# Patient Record
Sex: Female | Born: 1995 | Race: White | Hispanic: No | Marital: Single | Smoking: Current some day smoker
Health system: Southern US, Community
[De-identification: ages and names within clinical notes are randomized; demographics above are authoritative.]

## PROBLEM LIST (undated history)

## (undated) DIAGNOSIS — Q796 Ehlers-Danlos syndrome, unspecified: Secondary | ICD-10-CM

## (undated) DIAGNOSIS — F909 Attention-deficit hyperactivity disorder, unspecified type: Secondary | ICD-10-CM

## (undated) DIAGNOSIS — G90A Postural orthostatic tachycardia syndrome (POTS): Secondary | ICD-10-CM

## (undated) DIAGNOSIS — I498 Other specified cardiac arrhythmias: Secondary | ICD-10-CM

## (undated) DIAGNOSIS — F419 Anxiety disorder, unspecified: Secondary | ICD-10-CM

## (undated) DIAGNOSIS — D500 Iron deficiency anemia secondary to blood loss (chronic): Secondary | ICD-10-CM

## (undated) DIAGNOSIS — S069X9A Unspecified intracranial injury with loss of consciousness of unspecified duration, initial encounter: Secondary | ICD-10-CM

## (undated) HISTORY — DX: Other specified cardiac arrhythmias: I49.8

## (undated) HISTORY — DX: Attention-deficit hyperactivity disorder, unspecified type: F90.9

## (undated) HISTORY — DX: Ehlers-Danlos syndrome, unspecified: Q79.60

## (undated) HISTORY — DX: Postural orthostatic tachycardia syndrome (POTS): G90.A

---
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
## 2016-06-07 ENCOUNTER — Emergency Department (HOSPITAL_COMMUNITY): Payer: BC Managed Care – PPO

## 2016-06-07 ENCOUNTER — Emergency Department (HOSPITAL_COMMUNITY)
Admission: EM | Admit: 2016-06-07 | Discharge: 2016-06-07 | Disposition: A | Discharge status: Home / Self Care | Payer: BC Managed Care – PPO | Attending: Emergency Medicine | Admitting: Emergency Medicine

## 2016-06-07 ENCOUNTER — Encounter (HOSPITAL_COMMUNITY): Payer: Self-pay | Admitting: Emergency Medicine

## 2016-06-07 DIAGNOSIS — E876 Hypokalemia: Secondary | ICD-10-CM

## 2016-06-07 DIAGNOSIS — Z79899 Other long term (current) drug therapy: Secondary | ICD-10-CM | POA: Diagnosis not present

## 2016-06-07 DIAGNOSIS — W010XXA Fall on same level from slipping, tripping and stumbling without subsequent striking against object, initial encounter: Secondary | ICD-10-CM | POA: Insufficient documentation

## 2016-06-07 DIAGNOSIS — S50311A Abrasion of right elbow, initial encounter: Secondary | ICD-10-CM | POA: Insufficient documentation

## 2016-06-07 DIAGNOSIS — S80812A Abrasion, left lower leg, initial encounter: Secondary | ICD-10-CM | POA: Insufficient documentation

## 2016-06-07 DIAGNOSIS — R4182 Altered mental status, unspecified: Secondary | ICD-10-CM | POA: Diagnosis present

## 2016-06-07 DIAGNOSIS — Y999 Unspecified external cause status: Secondary | ICD-10-CM | POA: Diagnosis not present

## 2016-06-07 DIAGNOSIS — F10129 Alcohol abuse with intoxication, unspecified: Secondary | ICD-10-CM | POA: Diagnosis not present

## 2016-06-07 DIAGNOSIS — F10929 Alcohol use, unspecified with intoxication, unspecified: Secondary | ICD-10-CM

## 2016-06-07 DIAGNOSIS — Y939 Activity, unspecified: Secondary | ICD-10-CM | POA: Diagnosis not present

## 2016-06-07 DIAGNOSIS — Y92410 Unspecified street and highway as the place of occurrence of the external cause: Secondary | ICD-10-CM | POA: Insufficient documentation

## 2016-06-07 DIAGNOSIS — S80811A Abrasion, right lower leg, initial encounter: Secondary | ICD-10-CM | POA: Insufficient documentation

## 2016-06-07 HISTORY — DX: Unspecified intracranial injury with loss of consciousness of unspecified duration, initial encounter: S06.9X9A

## 2016-06-07 HISTORY — DX: Iron deficiency anemia secondary to blood loss (chronic): D50.0

## 2016-06-07 HISTORY — DX: Anxiety disorder, unspecified: F41.9

## 2016-06-07 LAB — I-STAT CHEM 8, ED
BUN: 6 mg/dL (ref 6–20)
CALCIUM ION: 0.99 mmol/L — AB (ref 1.15–1.40)
Chloride: 105 mmol/L (ref 101–111)
Creatinine, Ser: 0.9 mg/dL (ref 0.44–1.00)
GLUCOSE: 88 mg/dL (ref 65–99)
HCT: 38 % (ref 36.0–46.0)
Hemoglobin: 12.9 g/dL (ref 12.0–15.0)
Potassium: 2.8 mmol/L — ABNORMAL LOW (ref 3.5–5.1)
SODIUM: 141 mmol/L (ref 135–145)
TCO2: 20 mmol/L (ref 0–100)

## 2016-06-07 LAB — RAPID URINE DRUG SCREEN, HOSP PERFORMED
AMPHETAMINES: POSITIVE — AB
BENZODIAZEPINES: NOT DETECTED
Barbiturates: NOT DETECTED
COCAINE: NOT DETECTED
Opiates: NOT DETECTED
Tetrahydrocannabinol: NOT DETECTED

## 2016-06-07 LAB — COMPREHENSIVE METABOLIC PANEL
ALBUMIN: 4.2 g/dL (ref 3.5–5.0)
ALT: 13 U/L — ABNORMAL LOW (ref 14–54)
ANION GAP: 11 (ref 5–15)
AST: 19 U/L (ref 15–41)
Alkaline Phosphatase: 69 U/L (ref 38–126)
BILIRUBIN TOTAL: 0.4 mg/dL (ref 0.3–1.2)
BUN: 8 mg/dL (ref 6–20)
CO2: 21 mmol/L — AB (ref 22–32)
Calcium: 8.7 mg/dL — ABNORMAL LOW (ref 8.9–10.3)
Chloride: 107 mmol/L (ref 101–111)
Creatinine, Ser: 0.65 mg/dL (ref 0.44–1.00)
GFR calc non Af Amer: >60 mL/min (ref >60)
GLUCOSE: 87 mg/dL (ref 65–99)
POTASSIUM: 2.7 mmol/L — AB (ref 3.5–5.1)
SODIUM: 139 mmol/L (ref 135–145)
TOTAL PROTEIN: 7.8 g/dL (ref 6.5–8.1)

## 2016-06-07 LAB — POC URINE PREG, ED: Preg Test, Ur: NEGATIVE

## 2016-06-07 LAB — ETHANOL: ALCOHOL ETHYL (B): 238 mg/dL — AB (ref <5)

## 2016-06-07 LAB — ACETAMINOPHEN LEVEL

## 2016-06-07 LAB — SALICYLATE LEVEL: Salicylate Lvl: <4 mg/dL (ref 2.8–30.0)

## 2016-06-07 LAB — MAGNESIUM: MAGNESIUM: 2.3 mg/dL (ref 1.7–2.4)

## 2016-06-07 MED ORDER — POTASSIUM CHLORIDE 10 MEQ/100ML IV SOLN
10.0000 meq | INTRAVENOUS | Status: DC
  Administered 2016-06-07: 10 meq via INTRAVENOUS
  Filled 2016-06-07: qty 100

## 2016-06-07 MED ORDER — SODIUM CHLORIDE 0.9 % IV BOLUS (SEPSIS)
1000.0000 mL | Freq: Once | INTRAVENOUS | Status: AC
  Administered 2016-06-07: 1000 mL via INTRAVENOUS

## 2016-06-07 MED ORDER — POTASSIUM CHLORIDE CRYS ER 20 MEQ PO TBCR: 40.0000 meq | EXTENDED_RELEASE_TABLET | Freq: Two times a day (BID) | ORAL | 0 refills | Status: DC

## 2016-06-07 MED ORDER — ONDANSETRON HCL 4 MG/2ML IJ SOLN
4.0000 mg | Freq: Once | INTRAMUSCULAR | Status: AC
  Administered 2016-06-07: 4 mg via INTRAVENOUS

## 2016-06-07 MED ORDER — POTASSIUM CHLORIDE CRYS ER 20 MEQ PO TBCR
80.0000 meq | EXTENDED_RELEASE_TABLET | Freq: Once | ORAL | Status: AC
  Administered 2016-06-07: 80 meq via ORAL
  Filled 2016-06-07: qty 4

## 2016-06-07 MED ORDER — SODIUM CHLORIDE 0.9 % IV SOLN
INTRAVENOUS | Status: DC
  Administered 2016-06-07: 03:00:00 via INTRAVENOUS

## 2016-06-07 NOTE — ED Notes (Signed)
Pt transported to radiology via stretcher 

## 2016-06-07 NOTE — ED Notes (Signed)
Pt vomited large amount in L lateral position, pt cleaned and linens changed.
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
## 2016-06-07 NOTE — ED Notes (Signed)
Pt able to tolerate PO fluid and take PO potassium. Mother and father at bedside.

## 2016-06-07 NOTE — ED Notes (Signed)
Pt gave permission when she initially arrived for staff to speak with her mother. Mother called on pts cellphone, [place] and will be here in @ 2 hrs.

## 2016-06-07 NOTE — ED Notes (Signed)
Bare hugger in place d/t continues shivering. Pt's skin cool to the touch MD aware

## 2016-06-07 NOTE — ED Notes (Signed)
Critical K+ of 2.7 reported to Dr. Nicanor AlconPalumbo and Autumn, RN.
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
## 2016-12-22 LAB — GHB SCREEN, S/P: GHB: NEGATIVE

## 2017-08-11 ENCOUNTER — Other Ambulatory Visit: Payer: Self-pay | Admitting: Psychiatry

## 2017-08-11 DIAGNOSIS — R42 Dizziness and giddiness: Secondary | ICD-10-CM

## 2017-08-20 ENCOUNTER — Ambulatory Visit: Payer: BC Managed Care – PPO

## 2017-08-23 ENCOUNTER — Ambulatory Visit
Admission: RE | Admit: 2017-08-23 | Discharge: 2017-08-23 | Disposition: A | Discharge status: Home / Self Care | Payer: BC Managed Care – PPO | Source: Ambulatory Visit | Attending: Psychiatry | Admitting: Psychiatry

## 2017-08-23 DIAGNOSIS — R42 Dizziness and giddiness: Secondary | ICD-10-CM

## 2017-08-23 DIAGNOSIS — R9089 Other abnormal findings on diagnostic imaging of central nervous system: Secondary | ICD-10-CM | POA: Diagnosis present

## 2020-03-11 DIAGNOSIS — D509 Iron deficiency anemia, unspecified: Secondary | ICD-10-CM

## 2020-04-10 ENCOUNTER — Observation Stay (HOSPITAL_COMMUNITY): Payer: BC Managed Care – PPO

## 2020-04-10 ENCOUNTER — Emergency Department (HOSPITAL_COMMUNITY): Payer: BC Managed Care – PPO

## 2020-04-10 ENCOUNTER — Other Ambulatory Visit: Payer: Self-pay

## 2020-04-10 ENCOUNTER — Encounter (HOSPITAL_COMMUNITY): Payer: Self-pay | Admitting: Emergency Medicine

## 2020-04-10 ENCOUNTER — Observation Stay (HOSPITAL_COMMUNITY)
Admission: EM | Admit: 2020-04-10 | Discharge: 2020-04-11 | Disposition: A | Discharge status: Home / Self Care | Payer: BC Managed Care – PPO | Attending: Family Medicine | Admitting: Family Medicine

## 2020-04-10 DIAGNOSIS — R569 Unspecified convulsions: Principal | ICD-10-CM

## 2020-04-10 DIAGNOSIS — S069XAA Unspecified intracranial injury with loss of consciousness status unknown, initial encounter: Secondary | ICD-10-CM

## 2020-04-10 DIAGNOSIS — Z20822 Contact with and (suspected) exposure to covid-19: Secondary | ICD-10-CM | POA: Insufficient documentation

## 2020-04-10 DIAGNOSIS — Z87820 Personal history of traumatic brain injury: Secondary | ICD-10-CM | POA: Diagnosis not present

## 2020-04-10 LAB — CBG MONITORING, ED: Glucose-Capillary: 120 mg/dL — ABNORMAL HIGH (ref 70–99)

## 2020-04-10 LAB — BASIC METABOLIC PANEL
Anion gap: 12 (ref 5–15)
BUN: 8 mg/dL (ref 6–20)
CO2: 24 mmol/L (ref 22–32)
Calcium: 9.2 mg/dL (ref 8.9–10.3)
Chloride: 99 mmol/L (ref 98–111)
Creatinine, Ser: 0.74 mg/dL (ref 0.44–1.00)
GFR calc Af Amer: >60 mL/min (ref >60)
GFR calc non Af Amer: >60 mL/min (ref >60)
Glucose, Bld: 113 mg/dL — ABNORMAL HIGH (ref 70–99)
Potassium: 4.4 mmol/L (ref 3.5–5.1)
Sodium: 135 mmol/L (ref 135–145)

## 2020-04-10 LAB — CBC
HCT: 40.7 % (ref 36.0–46.0)
Hemoglobin: 13.2 g/dL (ref 12.0–15.0)
MCH: 29.8 pg (ref 26.0–34.0)
MCHC: 32.4 g/dL (ref 30.0–36.0)
MCV: 91.9 fL (ref 80.0–100.0)
Platelets: 432 10*3/uL — ABNORMAL HIGH (ref 150–400)
RBC: 4.43 MIL/uL (ref 3.87–5.11)
RDW: 13.7 % (ref 11.5–15.5)
WBC: 10 10*3/uL (ref 4.0–10.5)
nRBC: 0 % (ref 0.0–0.2)

## 2020-04-10 LAB — SARS CORONAVIRUS 2 BY RT PCR (HOSPITAL ORDER, PERFORMED IN ~~LOC~~ HOSPITAL LAB): SARS Coronavirus 2: NEGATIVE

## 2020-04-10 LAB — I-STAT BETA HCG BLOOD, ED (MC, WL, AP ONLY): I-stat hCG, quantitative: <5 m[IU]/mL (ref <5)

## 2020-04-10 LAB — ETHANOL: Alcohol, Ethyl (B): <10 mg/dL (ref <10)

## 2020-04-10 MED ORDER — LEVETIRACETAM IN NACL 1000 MG/100ML IV SOLN
1000.0000 mg | Freq: Once | INTRAVENOUS | Status: AC
  Administered 2020-04-10: 19:00:00 1000 mg via INTRAVENOUS
  Filled 2020-04-10: qty 100

## 2020-04-10 MED ORDER — ONDANSETRON HCL 4 MG PO TABS: 4.0000 mg | ORAL_TABLET | Freq: Four times a day (QID) | ORAL | Status: DC | PRN

## 2020-04-10 MED ORDER — ACETAMINOPHEN 325 MG PO TABS: 650.0000 mg | ORAL_TABLET | ORAL | Status: DC | PRN

## 2020-04-10 MED ORDER — LISDEXAMFETAMINE DIMESYLATE 30 MG PO CAPS
50.0000 mg | ORAL_CAPSULE | Freq: Every morning | ORAL | Status: DC
  Filled 2020-04-10 (×2): qty 1

## 2020-04-10 MED ORDER — ACETAMINOPHEN 650 MG RE SUPP: 650.0000 mg | RECTAL | Status: DC | PRN

## 2020-04-10 MED ORDER — NORGESTIMATE-ETH ESTRADIOL 0.25-35 MG-MCG PO TABS: 1.0000 | ORAL_TABLET | Freq: Every day | ORAL | Status: DC

## 2020-04-10 MED ORDER — PROPRANOLOL HCL 20 MG PO TABS
20.0000 mg | ORAL_TABLET | Freq: Two times a day (BID) | ORAL | Status: DC
  Administered 2020-04-10: 20 mg via ORAL
  Filled 2020-04-10 (×4): qty 1

## 2020-04-10 MED ORDER — ONDANSETRON HCL 4 MG/2ML IJ SOLN: 4.0000 mg | Freq: Four times a day (QID) | INTRAMUSCULAR | Status: DC | PRN

## 2020-04-10 MED ORDER — FLUOXETINE HCL 20 MG PO CAPS
30.0000 mg | ORAL_CAPSULE | Freq: Every day | ORAL | Status: DC
  Administered 2020-04-10: 30 mg via ORAL
  Filled 2020-04-10 (×2): qty 1

## 2020-04-10 MED ORDER — MIDODRINE HCL 5 MG PO TABS
2.5000 mg | ORAL_TABLET | Freq: Two times a day (BID) | ORAL | Status: DC
  Filled 2020-04-10: qty 1

## 2020-04-10 MED ORDER — FLUOXETINE HCL 10 MG PO CAPS: 10.0000 mg | ORAL_CAPSULE | Freq: Every day | ORAL | Status: DC

## 2020-04-10 MED ORDER — LEVETIRACETAM IN NACL 500 MG/100ML IV SOLN
500.0000 mg | Freq: Two times a day (BID) | INTRAVENOUS | Status: DC
  Administered 2020-04-11: 05:00:00 500 mg via INTRAVENOUS
  Filled 2020-04-10: qty 100

## 2020-04-10 NOTE — ED Notes (Signed)
Pt returned from MRI at this time, hospitalist at bed.

## 2020-04-10 NOTE — Progress Notes (Signed)
EEG complete - results pending 

## 2020-04-10 NOTE — ED Notes (Signed)
Patient transported to MRI 
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
## 2020-04-10 NOTE — ED Triage Notes (Addendum)
Patient arrives to ED via [facility] after witnessed tonic clonic seizure [place] a [employer]. Per EMS patient was witnessed screaming then falling to floor with all extremities flexed. Patient has no memory of event and bystanders stated patient did not hit head. Patient complaints of neck pain 8/10. Hx TBI and POTS.
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
## 2020-04-11 DIAGNOSIS — R569 Unspecified convulsions: Secondary | ICD-10-CM | POA: Diagnosis not present

## 2020-04-11 LAB — HIV ANTIBODY (ROUTINE TESTING W REFLEX): HIV Screen 4th Generation wRfx: NONREACTIVE

## 2020-04-11 LAB — RAPID URINE DRUG SCREEN, HOSP PERFORMED
Amphetamines: POSITIVE — AB
Barbiturates: NOT DETECTED
Benzodiazepines: NOT DETECTED
Cocaine: NOT DETECTED
Opiates: NOT DETECTED
Tetrahydrocannabinol: POSITIVE — AB

## 2020-04-11 MED ORDER — MAGIC MOUTHWASH: 5.0000 mL | Freq: Four times a day (QID) | ORAL | 0 refills | Status: DC | PRN

## 2020-04-11 MED ORDER — LACTATED RINGERS IV BOLUS
1000.0000 mL | Freq: Once | INTRAVENOUS | Status: AC
  Administered 2020-04-11: 1000 mL via INTRAVENOUS

## 2020-04-11 MED ORDER — LEVETIRACETAM 500 MG PO TABS: 500.0000 mg | ORAL_TABLET | Freq: Two times a day (BID) | ORAL | 0 refills | Status: DC

## 2020-04-11 MED ORDER — LISDEXAMFETAMINE DIMESYLATE 30 MG PO CAPS: 50.0000 mg | ORAL_CAPSULE | Freq: Every day | ORAL | Status: DC

## 2020-04-11 NOTE — ED Notes (Signed)
MD Chotnier notified about systolic b/p in 80's. MD Chotnier gave verbal order for 1,000 mL bolus of LR.
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
## 2020-04-11 NOTE — Progress Notes (Signed)
Attempted to obtain report. Per nurse, Chestine Spore he is getting report and will return my call.
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
## 2020-04-11 NOTE — Discharge Instructions (Signed)
Seizure, Adult °A seizure is a sudden burst of abnormal electrical activity in the brain. Seizures usually last from 30 seconds to 2 minutes. They can cause many different symptoms. °Usually, seizures are not harmful unless they last a long time. °What are the causes? °Common causes of this condition include: °· Fever or infection. °· Conditions that affect the brain, such as: °? A brain abnormality that you were born with. °? A brain or head injury. °? Bleeding in the brain. °? A tumor. °? Stroke. °? Brain disorders such as autism or cerebral palsy. °· Low blood sugar. °· Conditions that are passed from parent to child (are inherited). °· Problems with substances, such as: °? Having a reaction to a drug or a medicine. °? Suddenly stopping the use of a substance (withdrawal). °In some cases, the cause may not be known. A person who has repeated seizures over time without a clear cause has a condition called epilepsy. °What increases the risk? °You are more likely to get this condition if you have: °· A family history of epilepsy. °· Had a seizure in the past. °· A brain disorder. °· A history of head injury, lack of oxygen at birth, or strokes. °What are the signs or symptoms? °There are many types of seizures. The symptoms vary depending on the type of seizure you have. Examples of symptoms during a seizure include: °· Shaking (convulsions). °· Stiffness in the body. °· Passing out (losing consciousness). °· Head nodding. °· Staring. °· Not responding to sound or touch. °· Loss of bladder control and bowel control. °Some people have symptoms right before and right after a seizure happens. °Symptoms before a seizure may include: °· Fear. °· Worry (anxiety). °· Feeling like you may vomit (nauseous). °· Feeling like the room is spinning (vertigo). °· Feeling like you saw or heard something before (déjà vu). °· Odd tastes or smells. °· Changes in how you see. You may see flashing lights or spots. °Symptoms after a  seizure happens can include: °· Confusion. °· Sleepiness. °· Headache. °· Weakness on one side of the body. °How is this treated? °Most seizures will stop on their own in under 5 minutes. In these cases, no treatment is needed. Seizures that last longer than 5 minutes will usually need treatment. Treatment can include: °· Medicines given through an IV tube. °· Avoiding things that are known to cause your seizures. These can include medicines that you take for another condition. °· Medicines to treat epilepsy. °· Surgery to stop the seizures. This may be needed if medicines do not help. °Follow these instructions at home: °Medicines °· Take over-the-counter and prescription medicines only as told by your doctor. °· Do not eat or drink anything that may keep your medicine from working, such as alcohol. °Activity °· Do not do any activities that would be dangerous if you had another seizure, like driving or swimming. Wait until your doctor says it is safe for you to do them. °· If you live in the [place] ask your local DMV (department of motor vehicles) when you can drive. °· Get plenty of rest. °Teaching others °Teach friends and family what to do when you have a seizure. They should: °· Lay you on the ground. °· Protect your head and body. °· Loosen any tight clothing around your neck. °· Turn you on your side. °· Not hold you down. °· Not put anything into your mouth. °· Know whether or not you need emergency care. °· Stay   with you until you are better. ° °General instructions °· Contact your doctor each time you have a seizure. °· Avoid anything that gives you seizures. °· Keep a seizure diary. Write down: °? What you think caused each seizure. °? What you remember about each seizure. °· Keep all follow-up visits as told by your doctor. This is important. °Contact a doctor if: °· You have another seizure. °· You have seizures more often. °· There is any change in what happens during your seizures. °· You keep having  seizures with treatment. °· You have symptoms of being sick or having an infection. °Get help right away if: °· You have a seizure that: °? Lasts longer than 5 minutes. °? Is different than seizures you had before. °? Makes it harder to breathe. °? Happens after you hurt your head. °· You have any of these symptoms after a seizure: °? Not being able to speak. °? Not being able to use a part of your body. °? Confusion. °? A bad headache. °· You have two or more seizures in a row. °· You do not wake up right after a seizure. °· You get hurt during a seizure. °These symptoms may be an emergency. Do not wait to see if the symptoms will go away. Get medical help right away. Call your local emergency services (911 in the [place] Do not drive yourself to the hospital. °Summary °· Seizures usually last from 30 seconds to 2 minutes. Usually, they are not harmful unless they last a long time. °· Do not eat or drink anything that may keep your medicine from working, such as alcohol. °· Teach friends and family what to do when you have a seizure. °· Contact your doctor each time you have a seizure. °This information is not intended to replace advice given to you by your health care provider. Make sure you discuss any questions you have with your health care provider. °Document Revised: 11/25/2018 Document Reviewed: 11/25/2018 °Elsevier Patient Education © 2020 [employer] ° °

## 2020-04-11 NOTE — ED Notes (Signed)
Decreased BP reported to MD by previous RN, orders placed for LR bolus and initiated. Pt asleep on side when this RN entered room for initial shift assessment. Pt woke upon RN entering room--alert and oriented, asymptomatic. Will continue to monitor BP and reassess pt when fluid bolus complete.

## 2020-04-11 NOTE — ED Notes (Signed)
Breakfast Ordered--Emily James  

## 2020-04-11 NOTE — ED Notes (Signed)
Paged [facility] for RN Candise Bowens by Lurline Idol

## 2020-05-02 ENCOUNTER — Encounter: Payer: Self-pay | Admitting: Neurology

## 2020-05-24 ENCOUNTER — Other Ambulatory Visit: Payer: Self-pay

## 2020-05-24 ENCOUNTER — Ambulatory Visit: Payer: BC Managed Care – PPO | Admitting: Neurology

## 2020-05-24 ENCOUNTER — Encounter: Payer: Self-pay | Admitting: Neurology

## 2020-05-24 VITALS — BP 111/75 | HR 94 | Ht 64.0 in | Wt 149.0 lb

## 2020-05-24 DIAGNOSIS — R404 Transient alteration of awareness: Secondary | ICD-10-CM

## 2020-05-24 DIAGNOSIS — R569 Unspecified convulsions: Secondary | ICD-10-CM

## 2020-05-24 MED ORDER — LEVETIRACETAM 500 MG PO TABS: 500.0000 mg | ORAL_TABLET | Freq: Two times a day (BID) | ORAL | 11 refills | Status: DC
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
## 2020-06-24 ENCOUNTER — Ambulatory Visit (INDEPENDENT_AMBULATORY_CARE_PROVIDER_SITE_OTHER): Payer: BC Managed Care – PPO | Admitting: Neurology

## 2020-06-24 ENCOUNTER — Other Ambulatory Visit: Payer: Self-pay

## 2020-06-24 DIAGNOSIS — R569 Unspecified convulsions: Secondary | ICD-10-CM | POA: Diagnosis not present

## 2020-06-24 DIAGNOSIS — R404 Transient alteration of awareness: Secondary | ICD-10-CM

## 2020-07-16 NOTE — Procedures (Signed)
ELECTROENCEPHALOGRAM REPORT  Dates of Recording: 06/24/2020 11:53AM to 06/26/2020 3:37AM  Patient's Name: Emily James MRN: [id number] Date of Birth: 1995-11-01  Referring Provider: Dr. Patrcia Dolly  Procedure: 39:45-hour ambulatory video EEG (no further recording after 10/6 3:37AM)  History: This is a 24 year old woman with recurrent staring spells, episode of stiffening in July 2021. EEG for classification.  Medications:  Keppra Prozac Propranolol Midodrine  Technical Summary: This is a 39:45-hour multichannel digital video EEG recording measured by the international 10-20 system with electrodes applied with paste and impedances below 5000 ohms performed as portable with EKG monitoring.  The digital EEG was referentially recorded, reformatted, and digitally filtered in a variety of bipolar and referential montages for optimal display.    DESCRIPTION OF RECORDING: During maximal wakefulness, the background activity consisted of a symmetric 12 Hz posterior dominant rhythm which was reactive to eye opening. Lambda waves are seen in the bilateral occipital regions, R>L during wakefulness. There were no epileptiform discharges or focal slowing seen in wakefulness.  During the recording, the patient progresses through wakefulness, drowsiness, and Stage 2 sleep. Posterior occipital sharp transients of sleep (POSTS) are seen.  Again, there were no epileptiform discharges seen.  Events: On 10/4 at 1220 hours, patient has a headache. Patient not on video. Electrographically, there were no EEG or EKG changes seen.  On 10/5 at 1937 hours, she reports that she caught herself staring, a few twitches after. Patient is seen sitting on the couch looking straight, no other movements seen. She then starts writing at 1937 hours. Electrographically, there were no EEG or EKG changes seen.  On 10/6, patient reports that at 2:30ish (did not specify AM or PM), she had a heart rate spike while sitting. She is  not on video at 0230 hours, no EEG changes seen, HR 102 bpm.   On 10/7 at 0748 hours, she has a twitch about 2 times (right shoulder and head/neck). Patient not on video, no EEG recording at this time.  There were no electrographic seizures seen.  EKG lead was unremarkable.  IMPRESSION: This 39-hour ambulatory video EEG study is normal.    CLINICAL CORRELATION: A normal EEG does not exclude a clinical diagnosis of epilepsy. Episode of staring, twitches, headache, did not show any epileptiform correlate, indicating these are non-epileptic. If further clinical questions remain, inpatient video EEG monitoring may be helpful.   Patrcia Dolly, M.D.

## 2020-07-19 ENCOUNTER — Other Ambulatory Visit: Payer: Self-pay | Admitting: Hematology and Oncology

## 2020-07-19 DIAGNOSIS — D509 Iron deficiency anemia, unspecified: Secondary | ICD-10-CM | POA: Diagnosis not present

## 2020-07-19 LAB — CBC AND DIFFERENTIAL
HCT: 39 (ref 36–46)
Hemoglobin: 13.4 (ref 12.0–16.0)
Platelets: 345 (ref 150–399)
WBC: 7.2

## 2020-07-19 LAB — CBC: RBC: 4.35 (ref 3.87–5.11)

## 2020-07-19 LAB — IRON,TIBC AND FERRITIN PANEL
%SAT: 28.1
Ferritin: 169
Iron: 95
TIBC: 337

## 2020-07-19 LAB — VITAMIN B12: Vitamin B-12: 638

## 2020-07-24 ENCOUNTER — Other Ambulatory Visit: Payer: Self-pay

## 2020-07-24 ENCOUNTER — Encounter: Payer: Self-pay | Admitting: Neurology

## 2020-07-24 ENCOUNTER — Ambulatory Visit (INDEPENDENT_AMBULATORY_CARE_PROVIDER_SITE_OTHER): Payer: BC Managed Care – PPO | Admitting: Neurology

## 2020-07-24 VITALS — BP 102/68 | HR 82 | Ht 64.0 in | Wt 153.2 lb

## 2020-07-24 DIAGNOSIS — R569 Unspecified convulsions: Secondary | ICD-10-CM

## 2020-07-24 DIAGNOSIS — R404 Transient alteration of awareness: Secondary | ICD-10-CM

## 2020-07-24 MED ORDER — LEVETIRACETAM 500 MG PO TABS
ORAL_TABLET | ORAL | 3 refills | Status: DC
Start: 2020-07-24 — End: 2020-10-25
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
## 2020-08-05 ENCOUNTER — Ambulatory Visit: Payer: BC Managed Care – PPO | Admitting: Neurology

## 2020-08-19 ENCOUNTER — Encounter: Payer: Self-pay | Admitting: Oncology

## 2020-09-10 ENCOUNTER — Ambulatory Visit: Payer: BC Managed Care – PPO | Admitting: Neurology

## 2020-10-25 ENCOUNTER — Encounter: Payer: Self-pay | Admitting: Neurology

## 2020-10-25 ENCOUNTER — Other Ambulatory Visit: Payer: Self-pay

## 2020-10-25 ENCOUNTER — Telehealth (INDEPENDENT_AMBULATORY_CARE_PROVIDER_SITE_OTHER): Payer: Self-pay | Admitting: Neurology

## 2020-10-25 VITALS — Ht 64.0 in | Wt 155.0 lb

## 2020-10-25 DIAGNOSIS — R404 Transient alteration of awareness: Secondary | ICD-10-CM

## 2020-10-25 DIAGNOSIS — R569 Unspecified convulsions: Secondary | ICD-10-CM

## 2020-10-25 MED ORDER — LEVETIRACETAM 500 MG PO TABS
ORAL_TABLET | ORAL | 3 refills | Status: DC
Start: 2020-10-25 — End: 2021-03-03
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
## 2020-12-22 IMAGING — MR MR HEAD W/O CM
7 of 14 series · 22 of 48 positions shown · non-contrast
Comparison: None.

CLINICAL DATA: Initial evaluation for acute seizure.

EXAM:
MRI HEAD WITHOUT CONTRAST
TECHNIQUE: Multiplanar, multiecho pulse sequences of the brain and surrounding
structures were obtained without intravenous contrast.

[Series 2: DWI · axial · 3.0mm · 0.94mm/px · z∈[-102,+38]mm · 6 of 100 slices shown (1 of 2)]
[im 1/100]
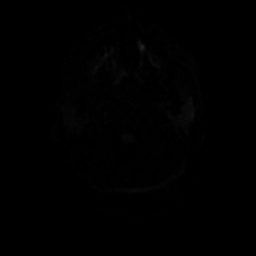
[im 20/100]
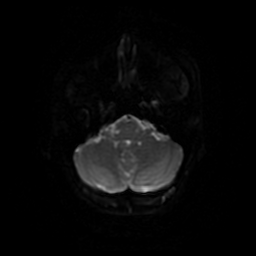
[im 40/100]
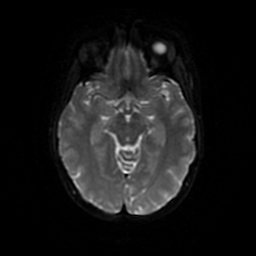
[im 60/100]
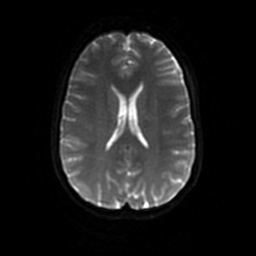
[im 80/100]
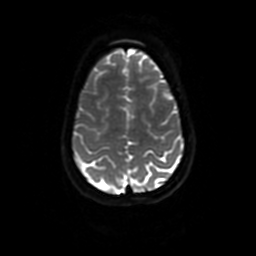
[im 100/100]
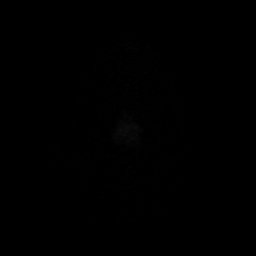

[Series 3: DWI · coronal · 4.0mm · 0.94mm/px · 4 of 74 slices shown (2 of 2)]
[im 1/74]
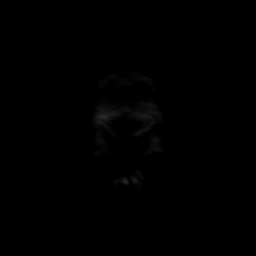
[im 25/74]
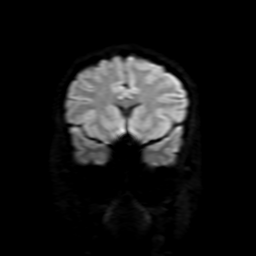
[im 49/74]
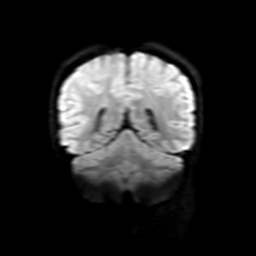
[im 74/74]
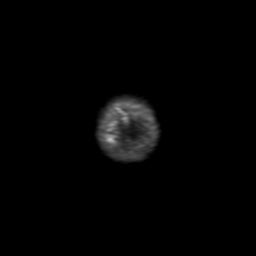

[Series 4: FLAIR · sagittal · 5.0mm · 0.23mm/px · 2 of 23 slices shown (1 of 3)]
[im 1/23]
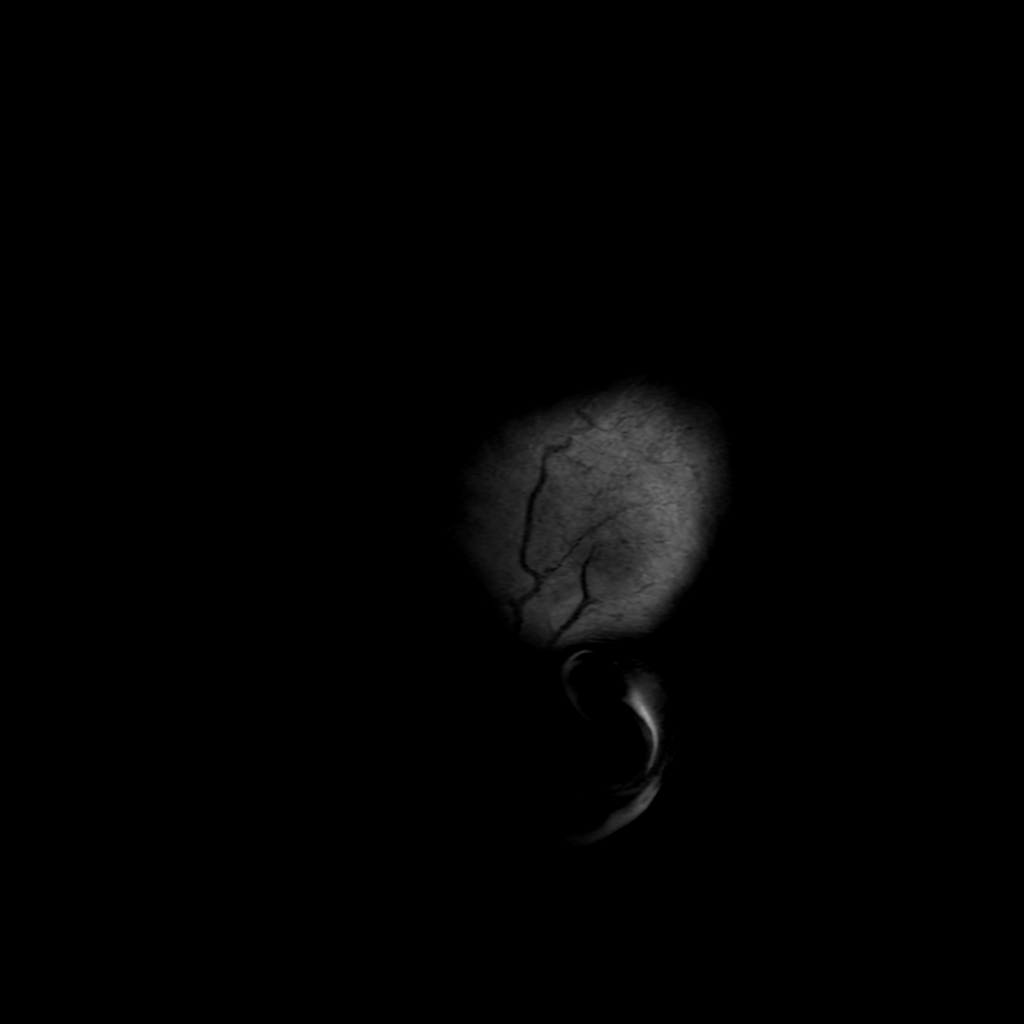
[im 23/23]
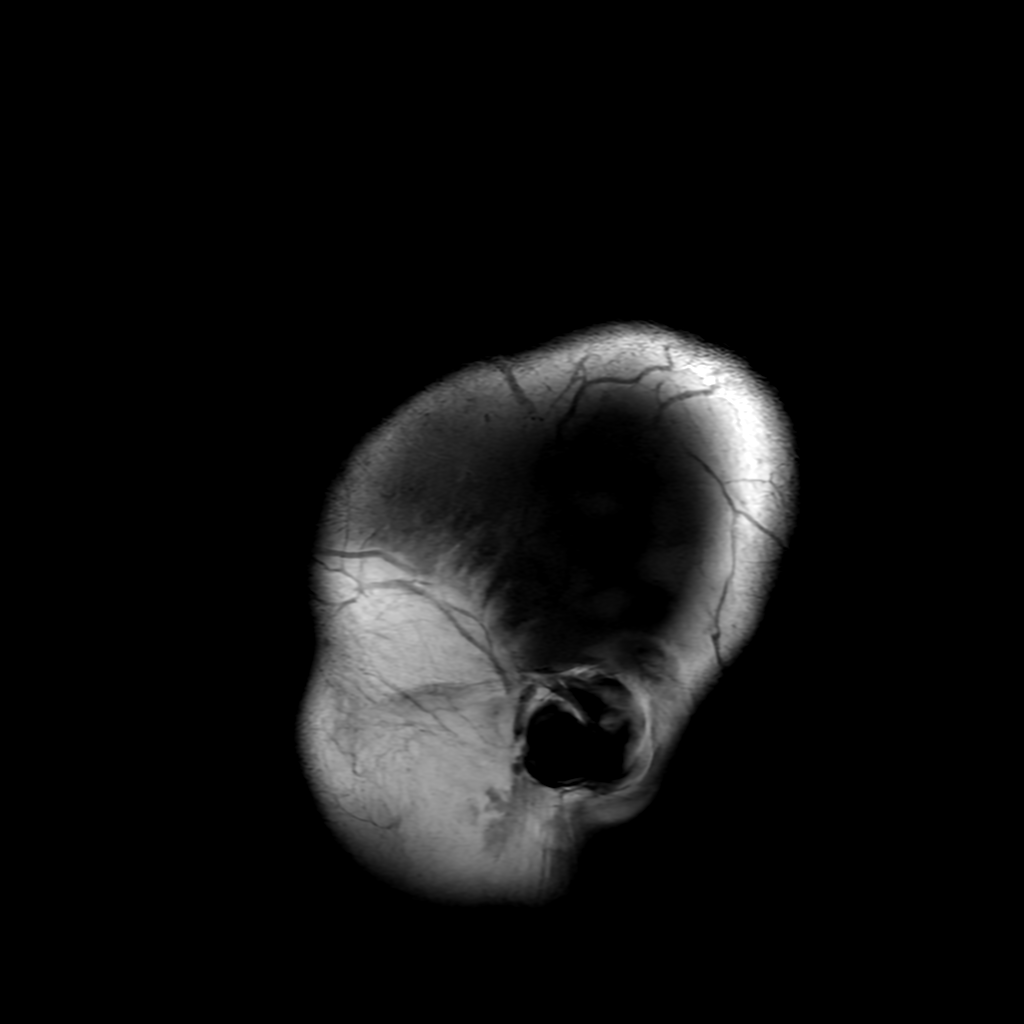

[Series 6: FLAIR · axial · 3.0mm · 0.41mm/px · z∈[-100,+33]mm · 2 of 25 slices shown (2 of 3)]
[im 1/25]
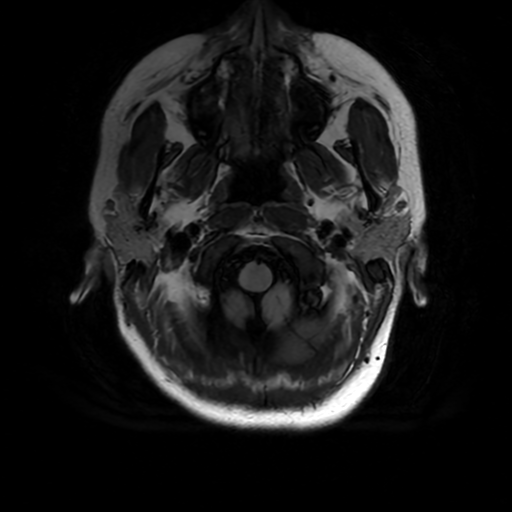
[im 25/25]
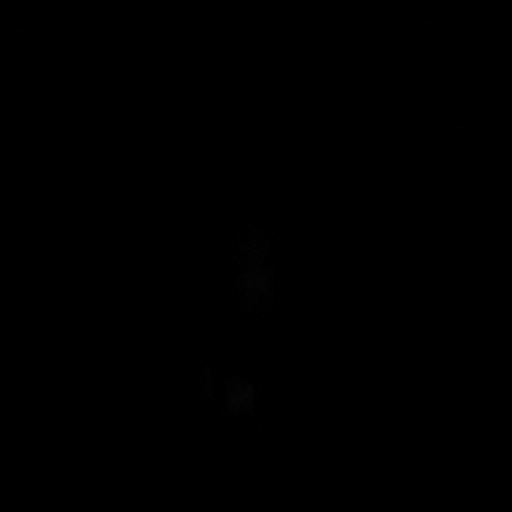

[Series 11: FLAIR · coronal · 3.0mm · 0.39mm/px · 2 of 26 slices shown (3 of 3)]
[im 1/26]
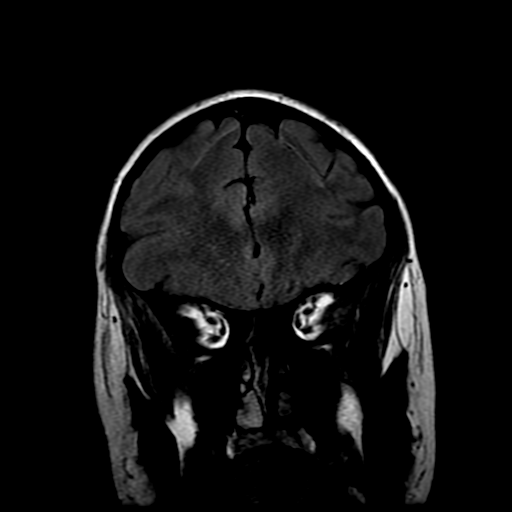
[im 26/26]
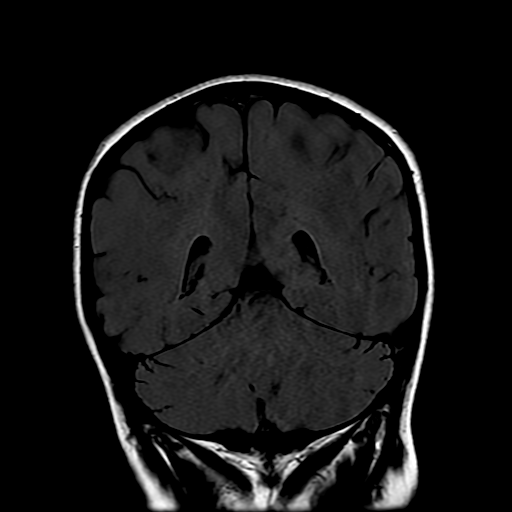

[Series 250: ADC · axial · 3.0mm · 0.94mm/px · z∈[-102,+38]mm · 3 of 50 slices shown (1 of 2)]
[im 1/50]
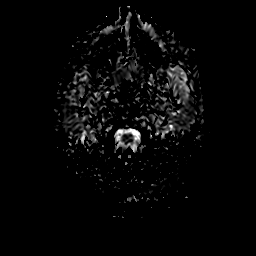
[im 25/50]
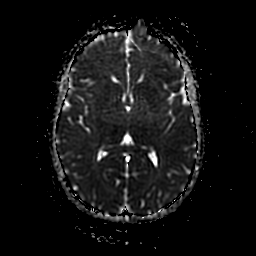
[im 50/50]
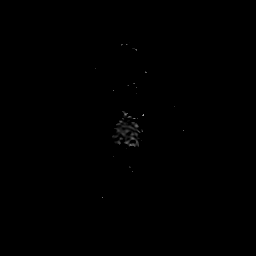

[Series 350: ADC · coronal · 4.0mm · 0.94mm/px · 3 of 37 slices shown (2 of 2)]
[im 1/37]
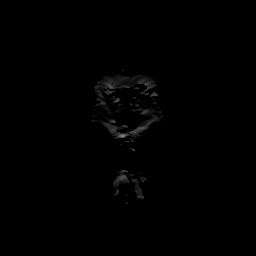
[im 19/37]
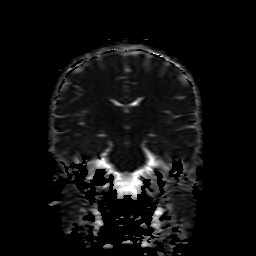
[im 37/37]
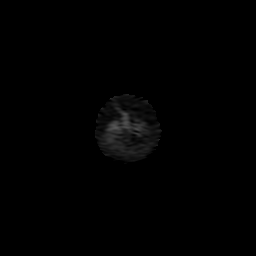

[22 of 48 positions shown; findings below may reference images not displayed]

FINDINGS: Brain: Cerebral volume within normal limits for patient age. No
focal parenchymal signal abnormality identified.

No abnormal foci of restricted diffusion to suggest acute or
subacute ischemia. Gray-white matter differentiation well
maintained. No encephalomalacia to suggest chronic infarction. No
foci of susceptibility artifact to suggest acute or chronic
intracranial hemorrhage.

No mass lesion, midline shift or mass effect. No hydrocephalus. No
extra-axial fluid collection. Major dural sinuses are grossly
patent.

Pituitary gland and suprasellar region are normal. Midline
structures intact and normal.

Vascular: Major intracranial vascular flow voids well maintained and
normal in appearance.

Skull and upper cervical spine: Craniocervical junction normal.
Visualized upper cervical spine within normal limits. Bone marrow
signal intensity normal. No scalp soft tissue abnormality.

Sinuses/Orbits: Globes and orbital soft tissues within normal
limits.

Paranasal sinuses are clear. No mastoid effusion. Inner ear
structures normal.

Other: None.
IMPRESSION: Normal brain MRI. No acute intracranial abnormality identified.

## 2020-12-22 IMAGING — CT CT CERVICAL SPINE W/O CM
3 of 4 series · 9 of 33 positions shown, 11 images · non-contrast
Comparison: CT head/cervical spine 06/07/2016. CT of the temporal
bones 08/23/2017.

CLINICAL DATA: Neck pain, acute, no red flags. Seizure,
nontraumatic.

EXAM:
CT HEAD WITHOUT CONTRAST
CT CERVICAL SPINE WITHOUT CONTRAST
TECHNIQUE: Multidetector CT imaging of the head and cervical spine was
performed following the standard protocol without intravenous
contrast. Multiplanar CT image reconstructions of the cervical spine
were also generated.

[Series 5: c_spine 2.0 st · axial · 0.27mm/px · z∈[-227,-227]mm · 1 of 110 slices shown, 2 images]
[im 55/110  soft-tissue]
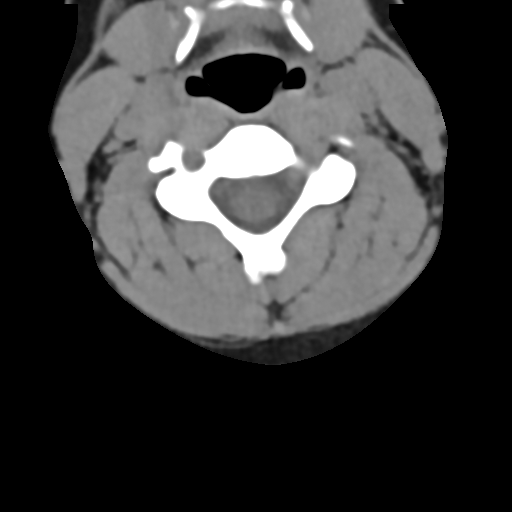
[im 55/110  bone]
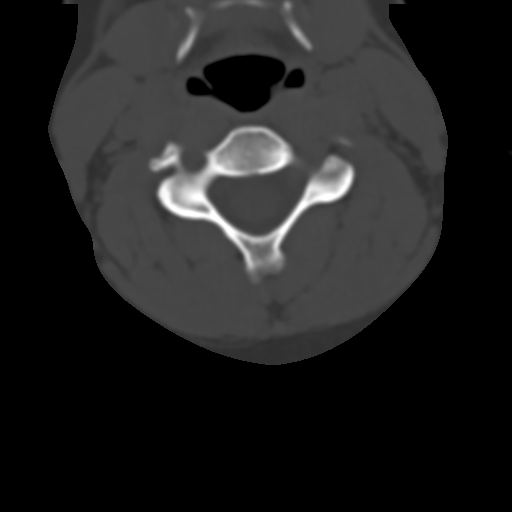

[Series 6: coronal bone · coronal · 0.23mm/px · 3 of 61 slices shown]
[im 13/61  bone]
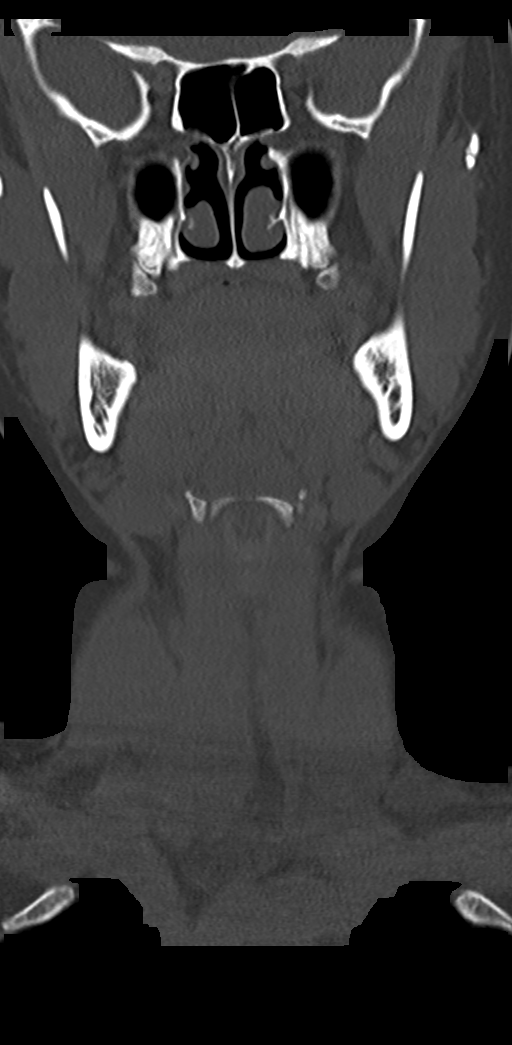
[im 25/61  bone]
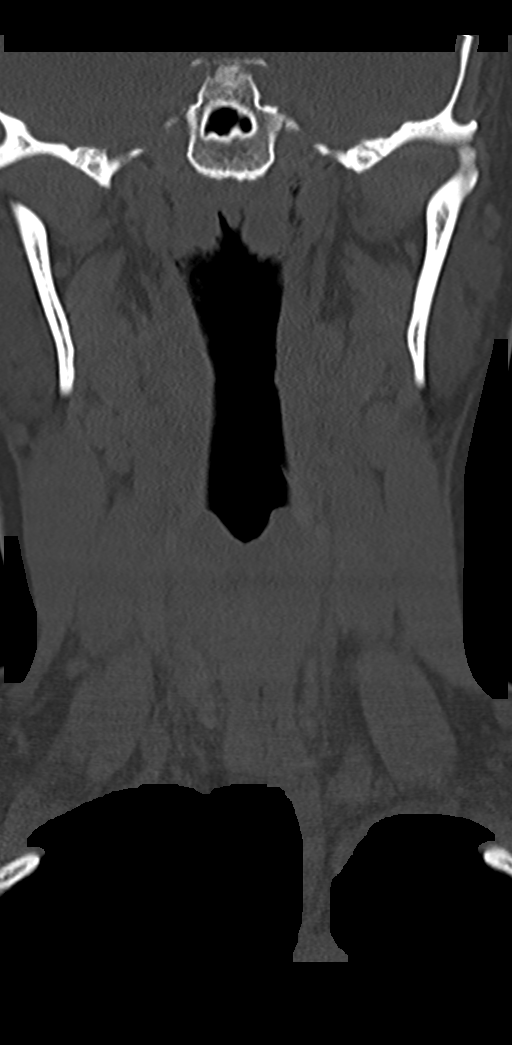
[im 37/61  bone]
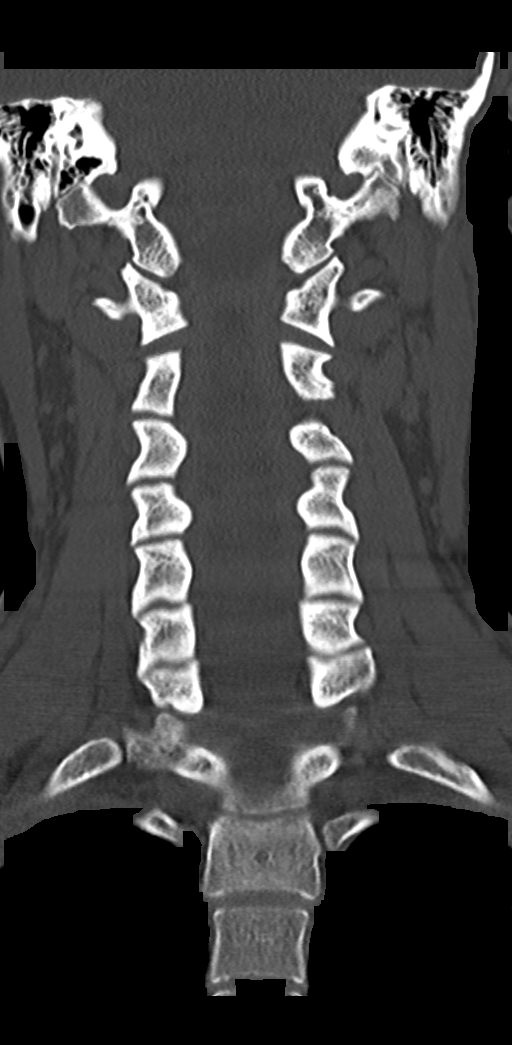

[Series 7: sagittal bone · sagittal · 0.23mm/px · 5 of 61 slices shown, 6 images]
[im 21/61  bone]
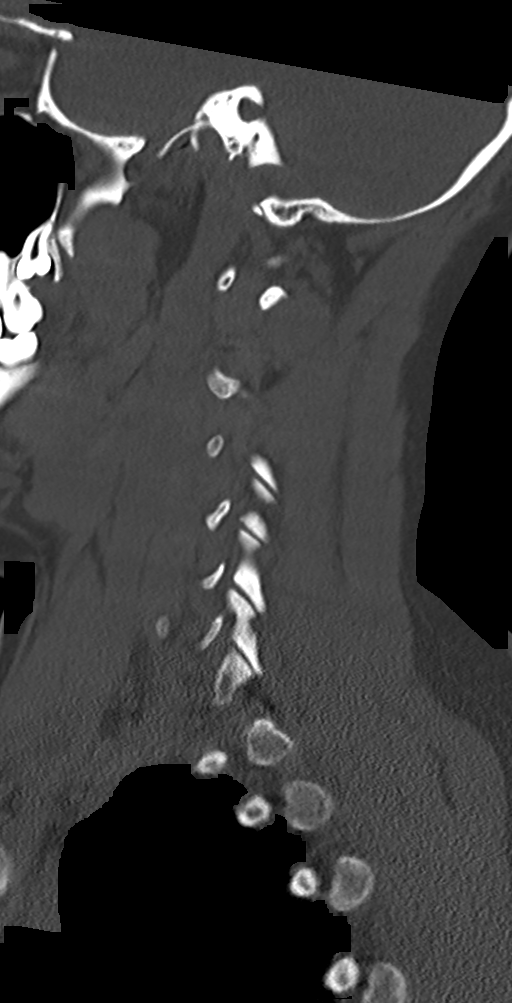
[im 26/61  bone]
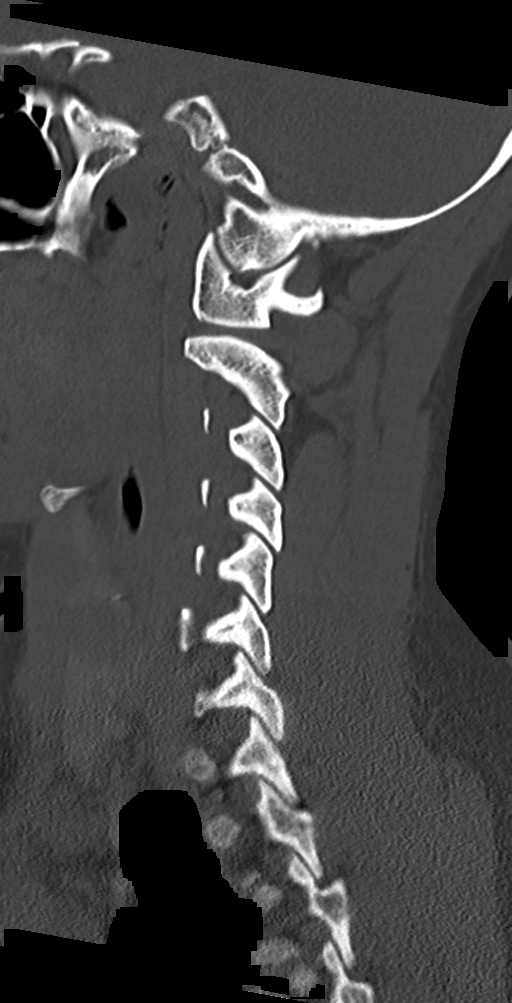
[im 31/61  soft-tissue]
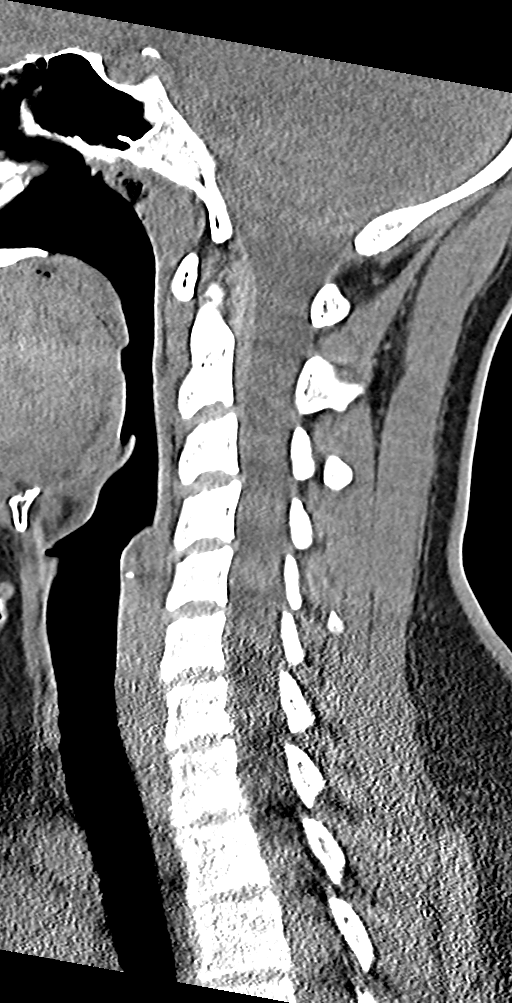
[im 31/61  bone]
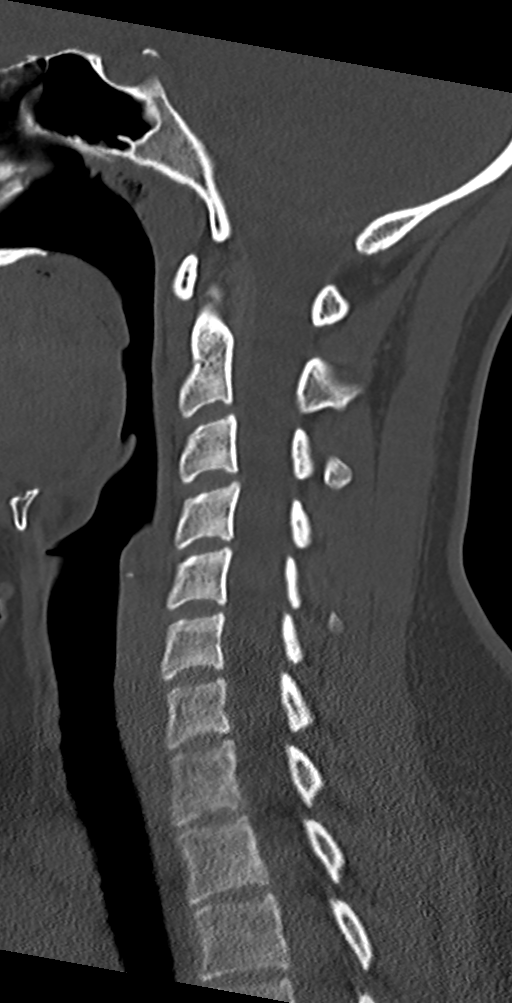
[im 36/61  bone]
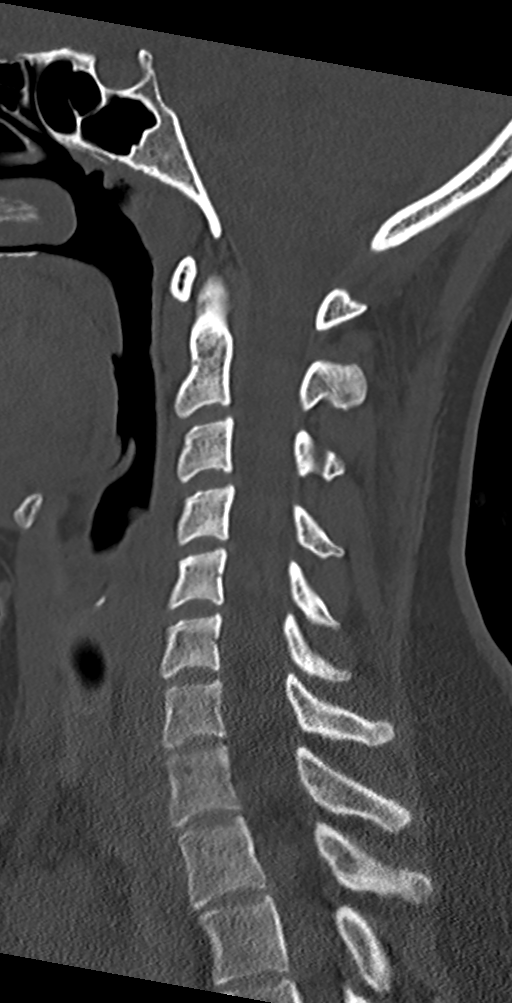
[im 41/61  bone]
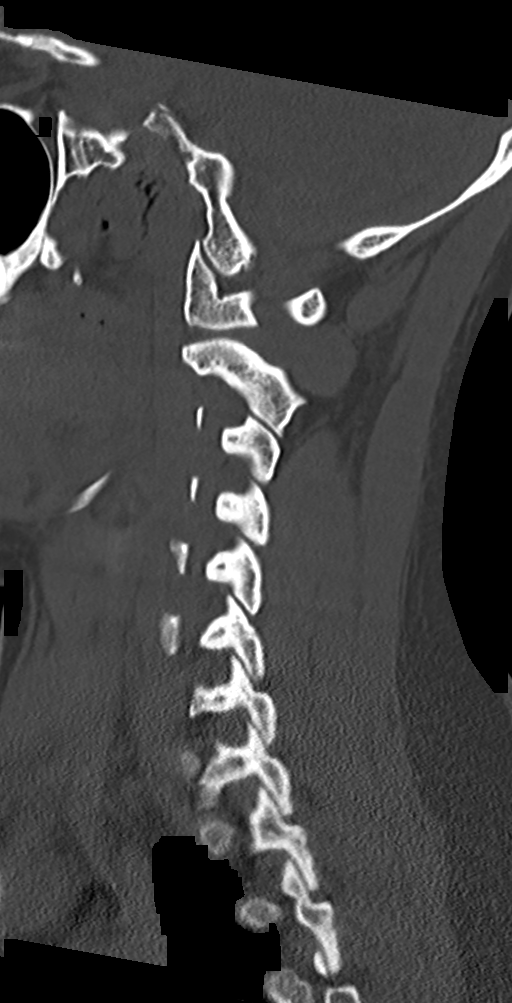

[9 of 33 positions shown; findings below may reference images not displayed]

FINDINGS: CT HEAD FINDINGS

Brain:

Cerebral volume is normal.

There is no acute intracranial hemorrhage.

No demarcated cortical infarct.

No extra-axial fluid collection.

No evidence of intracranial mass.

No midline shift.

Vascular: No hyperdense vessel.

Skull: Normal. Negative for fracture or focal lesion.

Sinuses/Orbits: Visualized orbits show no acute finding. No
significant paranasal sinus disease or mastoid effusion at the
imaged levels.

CT CERVICAL SPINE FINDINGS

Alignment: Nonspecific reversal of the expected cervical lordosis.
No significant spondylolisthesis.

Skull base and vertebrae: The basion-dental and atlanto-dental
intervals are maintained.No evidence of acute fracture to the
cervical spine.

Soft tissues and spinal canal: No prevertebral fluid or swelling. No
visible canal hematoma.

Disc levels: No significant bony spinal canal or neural foraminal
narrowing at any level.

Upper chest: No consolidation within the imaged lung apices. No
visible pneumothorax
IMPRESSION: CT head:

Unremarkable non-contrast CT appearance of the brain. No evidence of
acute intracranial abnormality. If this is a first-time seizure,
consider brain MRI for further evaluation.

CT cervical spine:

1. No evidence of acute fracture to the cervical spine.
2. Nonspecific reversal of the expected cervical lordosis.

## 2021-01-17 ENCOUNTER — Inpatient Hospital Stay: Payer: Self-pay | Admitting: Oncology

## 2021-01-17 ENCOUNTER — Inpatient Hospital Stay: Payer: Self-pay

## 2021-01-28 NOTE — Progress Notes (Signed)
[facility]  [address] [place],  [place]  [address] [phone]  Clinic Day:  01/30/2021  Referring physician: No ref. provider found  This document serves as a record of services personally performed by Weston Settle, MD. It was created on their behalf by Curry,Lauren E, a trained medical scribe. The creation of this record is based on the scribe's personal observations and the provider's statements to them.  HISTORY OF PRESENT ILLNESS:  Emily James is a 25 y.o. female with iron deficiency anemia.  Labs also showed her to be B12 deficient. In the past, IV Feraheme was effective in replenishing her iron stores and normalizing her hemoglobin.  She continues to take monthly B12 injections to maintain her cobalamin stores. She comes in today for routine follow-up.  Since her last visit, the patient has been doing okay.   With respect to her anemia, she denies having increased fatigue or any overt forms of blood loss since her last visit.  VITALS:  Blood pressure 102/62, pulse (!) 58, temperature 98.6 F (37 C), resp. rate 14, height 5\' 4"  (1.626 m), weight 157 lb 6.4 oz (71.4 kg), SpO2 99 %.  Wt Readings from Last 3 Encounters:  01/30/21 157 lb 6.4 oz (71.4 kg)  10/25/20 155 lb (70.3 kg)  07/24/20 153 lb 3.2 oz (69.5 kg)    Body mass index is 27.02 kg/m.  Performance status (ECOG): 0 - Asymptomatic  PHYSICAL EXAM:  Physical Exam Constitutional:      General: She is not in acute distress.    Appearance: Normal appearance. She is normal weight.  HENT:     Head: Normocephalic and atraumatic.  Eyes:     General: No scleral icterus.    Extraocular Movements: Extraocular movements intact.     Conjunctiva/sclera: Conjunctivae normal.     Pupils: Pupils are equal, round, and reactive to light.  Cardiovascular:     Rate and Rhythm: Normal rate and regular rhythm.     Pulses: Normal pulses.     Heart sounds: Normal heart sounds. No murmur  heard. No friction rub. No gallop.   Pulmonary:     Effort: Pulmonary effort is normal. No respiratory distress.     Breath sounds: Normal breath sounds.  Abdominal:     General: Bowel sounds are normal. There is no distension.     Palpations: Abdomen is soft. There is no hepatomegaly, splenomegaly or mass.     Tenderness: There is no abdominal tenderness.  Musculoskeletal:        General: Normal range of motion.     Cervical back: Normal range of motion and neck supple.     Right lower leg: No edema.     Left lower leg: No edema.  Lymphadenopathy:     Cervical: No cervical adenopathy.  Skin:    General: Skin is warm and dry.  Neurological:     General: No focal deficit present.     Mental Status: She is alert and oriented to person, place, and time. Mental status is at baseline.  Psychiatric:        Mood and Affect: Mood normal.        Behavior: Behavior normal.        Thought Content: Thought content normal.        Judgment: Judgment normal.    LABS:   CBC Latest Ref Rng & Units 01/30/2021 07/19/2020 04/10/2020  WBC - 7.6 7.2 10.0  Hemoglobin 12.0 - 16.0 13.1  13.4 13.2  Hematocrit 36 - 46 39 39 40.7  Platelets 150 - 399 283 345 432(H)   CMP Latest Ref Rng & Units 04/10/2020 06/07/2016 06/07/2016  Glucose 70 - 99 mg/dL 631(S) 88 87  BUN 6 - 20 mg/dL 8 6 8   Creatinine 0.44 - 1.00 mg/dL 9.70 2.63  Sodium 135 - 145 mmol/L 135 141 139  Potassium 3.5 - 5.1 mmol/L 4.4 2.8(L) 2.7(LL)  Chloride 98 - 111 mmol/L 99 105 107  CO2 22 - 32 mmol/L 24 - 21(L)  Calcium 8.9 - 10.3 mg/dL 9.2 - 8.7(L)  Total Protein 6.5 - 8.1 g/dL - - 7.8  Total Bilirubin 0.3 - 1.2 mg/dL - - 0.4  Alkaline Phos 38 - 126 U/L - - 69  AST 15 - 41 U/L - - 19  ALT 14 - 54 U/L - - 13(L)    Ref. Range 01/30/2021 10:18  Iron Latest Ref Range: 28 - 170 ug/dL 04/01/2021  UIBC Latest Units: ug/dL 885  TIBC Latest Ref Range: 250 - 450 ug/dL 027  Saturation Ratios Latest Ref Range: 10.4 - 31.8 % 36 (H)  Ferritin  Latest Ref Range: 11 - 307 ng/mL 137  Folate Latest Ref Range: >5.9 ng/mL 7.4  Vitamin B12 Latest Ref Range: 180 - 914 pg/mL 289   ASSESSMENT & PLAN:  Assessment/Plan:  A 25 year old woman with iron deficiency anemia, as well as vitamin B12 deficiency.  I am pleased as her iron stores and hemoglobin remain normal.  Furthermore, her B12 level remains normal.  She knows to continue taking her B12 injections on a monthly basis.  As she is stable from a hematologic standpoint, I will see her back in 1 year for repeat clinical assessment.  The patient understands all the plans discussed today and is in agreement with them.   I, 26, am acting as scribe for Foye Deer, MD    I have reviewed this report as typed by the medical scribe, and it is complete and accurate.  Dequincy Weston Settle, MD

## 2021-01-30 ENCOUNTER — Encounter: Payer: Self-pay | Admitting: Oncology

## 2021-01-30 ENCOUNTER — Other Ambulatory Visit: Payer: Self-pay

## 2021-01-30 ENCOUNTER — Inpatient Hospital Stay: Payer: BC Managed Care – PPO | Attending: Oncology

## 2021-01-30 ENCOUNTER — Inpatient Hospital Stay (INDEPENDENT_AMBULATORY_CARE_PROVIDER_SITE_OTHER): Payer: BC Managed Care – PPO | Admitting: Oncology

## 2021-01-30 ENCOUNTER — Other Ambulatory Visit: Payer: Self-pay | Admitting: Oncology

## 2021-01-30 ENCOUNTER — Telehealth: Payer: Self-pay | Admitting: Oncology

## 2021-01-30 DIAGNOSIS — D649 Anemia, unspecified: Secondary | ICD-10-CM

## 2021-01-30 DIAGNOSIS — E538 Deficiency of other specified B group vitamins: Secondary | ICD-10-CM | POA: Insufficient documentation

## 2021-01-30 DIAGNOSIS — D508 Other iron deficiency anemias: Secondary | ICD-10-CM | POA: Diagnosis not present

## 2021-01-30 DIAGNOSIS — D509 Iron deficiency anemia, unspecified: Secondary | ICD-10-CM | POA: Diagnosis not present

## 2021-01-30 LAB — CBC AND DIFFERENTIAL
HCT: 39 (ref 36–46)
Hemoglobin: 13.1 (ref 12.0–16.0)
Neutrophils Absolute: 3.95
Platelets: 283 (ref 150–399)
WBC: 7.6

## 2021-01-30 LAB — FOLATE: Folate: 7.4 ng/mL (ref >5.9)

## 2021-01-30 LAB — IRON AND TIBC
Iron: 144 ug/dL (ref 28–170)
Saturation Ratios: 36 % — ABNORMAL HIGH (ref 10.4–31.8)
TIBC: 399 ug/dL (ref 250–450)
UIBC: 255 ug/dL

## 2021-01-30 LAB — VITAMIN B12: Vitamin B-12: 289 pg/mL (ref 180–914)

## 2021-01-30 LAB — FERRITIN: Ferritin: 137 ng/mL (ref 11–307)

## 2021-01-30 LAB — CBC: RBC: 4.3 (ref 3.87–5.11)

## 2021-01-30 NOTE — Telephone Encounter (Signed)
Per 5/12 los next appt scheduled and given to patient 

## 2021-03-03 ENCOUNTER — Telehealth: Payer: Self-pay | Admitting: Neurology

## 2021-03-03 ENCOUNTER — Ambulatory Visit: Payer: BC Managed Care – PPO | Admitting: Neurology

## 2021-03-03 ENCOUNTER — Encounter: Payer: Self-pay | Admitting: Neurology

## 2021-03-03 ENCOUNTER — Other Ambulatory Visit: Payer: Self-pay

## 2021-03-03 VITALS — BP 112/73 | HR 84 | Ht 64.0 in | Wt 154.0 lb

## 2021-03-03 DIAGNOSIS — R569 Unspecified convulsions: Secondary | ICD-10-CM | POA: Diagnosis not present

## 2021-03-03 MED ORDER — LEVETIRACETAM 500 MG PO TABS: ORAL_TABLET | ORAL | 3 refills | Status: DC

## 2021-03-03 NOTE — Telephone Encounter (Signed)
Was seen today and mother needs a doctor note, could you call her and let her know
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
## 2021-03-03 NOTE — Telephone Encounter (Signed)
Letter is printed and up front for patient to pick up.
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
## 2021-03-13 LAB — LEVETIRACETAM, SERUM/PLASMA: LEVETIRACETAM, SERUM/PLASMA: 18 ug/mL

## 2021-09-02 ENCOUNTER — Ambulatory Visit: Payer: BC Managed Care – PPO | Admitting: Neurology

## 2022-01-30 ENCOUNTER — Other Ambulatory Visit: Payer: BC Managed Care – PPO

## 2022-01-30 ENCOUNTER — Ambulatory Visit: Payer: BC Managed Care – PPO | Admitting: Oncology

## 2022-01-30 ENCOUNTER — Ambulatory Visit: Payer: BC Managed Care – PPO | Admitting: Hematology and Oncology

## 2022-02-18 ENCOUNTER — Telehealth (INDEPENDENT_AMBULATORY_CARE_PROVIDER_SITE_OTHER): Payer: BC Managed Care – PPO | Admitting: Neurology

## 2022-02-18 ENCOUNTER — Encounter: Payer: Self-pay | Admitting: Neurology

## 2022-02-18 VITALS — Ht 64.0 in | Wt 165.0 lb

## 2022-02-18 DIAGNOSIS — R569 Unspecified convulsions: Secondary | ICD-10-CM | POA: Diagnosis not present

## 2022-02-18 MED ORDER — LEVETIRACETAM 500 MG PO TABS: ORAL_TABLET | ORAL | 3 refills | Status: AC
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
## 2022-02-18 NOTE — Addendum Note (Signed)
Addended by: Van Clines on: 02/18/2022 09:09 AM   Modules accepted: Orders

## 2022-09-04 ENCOUNTER — Ambulatory Visit: Payer: BC Managed Care – PPO | Admitting: Neurology
